# Patient Record
Sex: Female | Born: 1955 | Race: White | Hispanic: No | Marital: Married | Smoking: Never smoker
Health system: Southern US, Community
[De-identification: ages and names within clinical notes are randomized; demographics above are authoritative.]

## PROBLEM LIST (undated history)

## (undated) DIAGNOSIS — I10 Essential (primary) hypertension: Secondary | ICD-10-CM

## (undated) HISTORY — PX: TONSILLECTOMY: SUR1361

## (undated) HISTORY — PX: CARPAL TUNNEL RELEASE: SHX101

## (undated) HISTORY — PX: CHOLECYSTECTOMY: SHX55

## (undated) HISTORY — PX: ABDOMINAL HYSTERECTOMY: SHX81

---

## 2005-02-03 ENCOUNTER — Ambulatory Visit: Payer: Self-pay | Admitting: Internal Medicine

## 2006-02-26 ENCOUNTER — Ambulatory Visit: Payer: Self-pay | Admitting: Internal Medicine

## 2006-06-08 ENCOUNTER — Ambulatory Visit: Payer: Self-pay | Admitting: Gastroenterology

## 2007-05-06 ENCOUNTER — Ambulatory Visit: Payer: Self-pay | Admitting: Internal Medicine

## 2007-05-20 ENCOUNTER — Ambulatory Visit: Payer: Self-pay | Admitting: Gastroenterology

## 2007-06-21 ENCOUNTER — Ambulatory Visit: Payer: Self-pay | Admitting: Unknown Physician Specialty

## 2007-11-15 ENCOUNTER — Ambulatory Visit: Payer: Self-pay | Admitting: Unknown Physician Specialty

## 2008-02-18 ENCOUNTER — Other Ambulatory Visit: Payer: Self-pay

## 2008-02-19 ENCOUNTER — Inpatient Hospital Stay: Payer: Self-pay | Admitting: Internal Medicine

## 2008-06-01 ENCOUNTER — Ambulatory Visit: Payer: Self-pay | Admitting: Internal Medicine

## 2008-06-15 ENCOUNTER — Ambulatory Visit: Payer: Self-pay | Admitting: Specialist

## 2009-06-13 ENCOUNTER — Ambulatory Visit: Payer: Self-pay | Admitting: Internal Medicine

## 2009-06-19 ENCOUNTER — Ambulatory Visit: Payer: Self-pay | Admitting: Internal Medicine

## 2009-10-24 ENCOUNTER — Ambulatory Visit: Payer: Self-pay | Admitting: Internal Medicine

## 2010-06-23 ENCOUNTER — Emergency Department: Payer: Self-pay | Admitting: Emergency Medicine

## 2010-06-26 ENCOUNTER — Ambulatory Visit: Payer: Self-pay | Admitting: Urology

## 2010-09-10 ENCOUNTER — Ambulatory Visit: Payer: Self-pay | Admitting: Internal Medicine

## 2011-09-24 ENCOUNTER — Ambulatory Visit: Payer: Self-pay | Admitting: Internal Medicine

## 2012-10-04 ENCOUNTER — Ambulatory Visit: Payer: Self-pay | Admitting: Internal Medicine

## 2013-11-09 ENCOUNTER — Ambulatory Visit: Payer: Self-pay | Admitting: Family Medicine

## 2014-10-27 ENCOUNTER — Other Ambulatory Visit: Payer: Self-pay | Admitting: Family Medicine

## 2014-10-27 DIAGNOSIS — Z1231 Encounter for screening mammogram for malignant neoplasm of breast: Secondary | ICD-10-CM

## 2014-11-15 ENCOUNTER — Ambulatory Visit
Admission: RE | Admit: 2014-11-15 | Discharge: 2014-11-15 | Disposition: A | Discharge status: Home / Self Care | Payer: No Typology Code available for payment source | Source: Ambulatory Visit | Attending: Family Medicine | Admitting: Family Medicine

## 2014-11-15 DIAGNOSIS — Z1231 Encounter for screening mammogram for malignant neoplasm of breast: Secondary | ICD-10-CM | POA: Insufficient documentation

## 2015-10-30 ENCOUNTER — Other Ambulatory Visit: Payer: Self-pay | Admitting: Family Medicine

## 2015-10-30 DIAGNOSIS — Z1231 Encounter for screening mammogram for malignant neoplasm of breast: Secondary | ICD-10-CM

## 2015-11-19 ENCOUNTER — Ambulatory Visit
Admission: RE | Admit: 2015-11-19 | Discharge: 2015-11-19 | Disposition: A | Discharge status: Home / Self Care | Payer: No Typology Code available for payment source | Source: Ambulatory Visit | Attending: Family Medicine | Admitting: Family Medicine

## 2015-11-19 ENCOUNTER — Other Ambulatory Visit: Payer: Self-pay | Admitting: Family Medicine

## 2015-11-19 DIAGNOSIS — Z1231 Encounter for screening mammogram for malignant neoplasm of breast: Secondary | ICD-10-CM

## 2016-10-08 ENCOUNTER — Other Ambulatory Visit: Payer: Self-pay | Admitting: Family Medicine

## 2016-10-08 DIAGNOSIS — Z1231 Encounter for screening mammogram for malignant neoplasm of breast: Secondary | ICD-10-CM

## 2016-11-19 ENCOUNTER — Ambulatory Visit
Admission: RE | Admit: 2016-11-19 | Discharge: 2016-11-19 | Disposition: A | Discharge status: Home / Self Care | Payer: Managed Care, Other (non HMO) | Source: Ambulatory Visit | Attending: Family Medicine | Admitting: Family Medicine

## 2016-11-19 DIAGNOSIS — Z1231 Encounter for screening mammogram for malignant neoplasm of breast: Secondary | ICD-10-CM | POA: Insufficient documentation

## 2017-03-03 ENCOUNTER — Other Ambulatory Visit: Payer: Self-pay | Admitting: Surgery

## 2017-03-03 DIAGNOSIS — S76011A Strain of muscle, fascia and tendon of right hip, initial encounter: Secondary | ICD-10-CM

## 2017-03-10 ENCOUNTER — Ambulatory Visit
Admission: RE | Admit: 2017-03-10 | Discharge: 2017-03-10 | Disposition: A | Discharge status: Home / Self Care | Payer: 59 | Source: Ambulatory Visit | Attending: Surgery | Admitting: Surgery

## 2017-03-10 DIAGNOSIS — S76011A Strain of muscle, fascia and tendon of right hip, initial encounter: Secondary | ICD-10-CM | POA: Diagnosis present

## 2017-03-10 DIAGNOSIS — M1611 Unilateral primary osteoarthritis, right hip: Secondary | ICD-10-CM | POA: Diagnosis not present

## 2017-03-10 DIAGNOSIS — D168 Benign neoplasm of pelvic bones, sacrum and coccyx: Secondary | ICD-10-CM | POA: Insufficient documentation

## 2017-03-10 DIAGNOSIS — X58XXXA Exposure to other specified factors, initial encounter: Secondary | ICD-10-CM | POA: Diagnosis not present

## 2017-09-07 ENCOUNTER — Other Ambulatory Visit: Payer: Self-pay | Admitting: Surgery

## 2017-09-07 DIAGNOSIS — S76011D Strain of muscle, fascia and tendon of right hip, subsequent encounter: Secondary | ICD-10-CM

## 2017-09-07 DIAGNOSIS — M5431 Sciatica, right side: Secondary | ICD-10-CM

## 2017-09-08 ENCOUNTER — Other Ambulatory Visit: Payer: Self-pay | Admitting: Surgery

## 2017-09-08 DIAGNOSIS — M5431 Sciatica, right side: Secondary | ICD-10-CM

## 2017-09-08 DIAGNOSIS — S76011D Strain of muscle, fascia and tendon of right hip, subsequent encounter: Secondary | ICD-10-CM

## 2017-09-16 ENCOUNTER — Ambulatory Visit
Admission: RE | Admit: 2017-09-16 | Discharge: 2017-09-16 | Disposition: A | Discharge status: Home / Self Care | Payer: 59 | Source: Ambulatory Visit | Attending: Surgery | Admitting: Surgery

## 2017-09-16 DIAGNOSIS — M4316 Spondylolisthesis, lumbar region: Secondary | ICD-10-CM | POA: Diagnosis not present

## 2017-09-16 DIAGNOSIS — M5136 Other intervertebral disc degeneration, lumbar region: Secondary | ICD-10-CM | POA: Diagnosis not present

## 2017-09-16 DIAGNOSIS — S76011D Strain of muscle, fascia and tendon of right hip, subsequent encounter: Secondary | ICD-10-CM | POA: Diagnosis not present

## 2017-09-16 DIAGNOSIS — M5431 Sciatica, right side: Secondary | ICD-10-CM | POA: Diagnosis not present

## 2017-09-16 DIAGNOSIS — M7138 Other bursal cyst, other site: Secondary | ICD-10-CM | POA: Diagnosis not present

## 2017-09-16 DIAGNOSIS — X58XXXD Exposure to other specified factors, subsequent encounter: Secondary | ICD-10-CM | POA: Diagnosis not present

## 2017-09-16 DIAGNOSIS — M48061 Spinal stenosis, lumbar region without neurogenic claudication: Secondary | ICD-10-CM | POA: Diagnosis not present

## 2017-11-05 ENCOUNTER — Other Ambulatory Visit: Payer: Self-pay | Admitting: Family Medicine

## 2017-11-05 DIAGNOSIS — Z1231 Encounter for screening mammogram for malignant neoplasm of breast: Secondary | ICD-10-CM

## 2017-11-24 ENCOUNTER — Ambulatory Visit
Admission: RE | Admit: 2017-11-24 | Discharge: 2017-11-24 | Disposition: A | Discharge status: Home / Self Care | Payer: 59 | Source: Ambulatory Visit | Attending: Family Medicine | Admitting: Family Medicine

## 2017-11-24 DIAGNOSIS — Z1231 Encounter for screening mammogram for malignant neoplasm of breast: Secondary | ICD-10-CM | POA: Diagnosis present

## 2018-08-06 ENCOUNTER — Other Ambulatory Visit: Payer: Self-pay | Admitting: Surgery

## 2018-08-06 DIAGNOSIS — M4316 Spondylolisthesis, lumbar region: Secondary | ICD-10-CM

## 2018-08-06 DIAGNOSIS — M47816 Spondylosis without myelopathy or radiculopathy, lumbar region: Secondary | ICD-10-CM

## 2018-08-16 ENCOUNTER — Ambulatory Visit
Admission: RE | Admit: 2018-08-16 | Discharge: 2018-08-16 | Disposition: A | Discharge status: Home / Self Care | Payer: 59 | Source: Ambulatory Visit | Attending: Surgery | Admitting: Surgery

## 2018-08-16 DIAGNOSIS — M4316 Spondylolisthesis, lumbar region: Secondary | ICD-10-CM | POA: Diagnosis present

## 2018-08-16 DIAGNOSIS — M47816 Spondylosis without myelopathy or radiculopathy, lumbar region: Secondary | ICD-10-CM | POA: Diagnosis present

## 2018-10-12 ENCOUNTER — Other Ambulatory Visit: Payer: Self-pay | Admitting: Family Medicine

## 2018-10-12 DIAGNOSIS — Z1231 Encounter for screening mammogram for malignant neoplasm of breast: Secondary | ICD-10-CM

## 2018-12-08 ENCOUNTER — Ambulatory Visit
Admission: RE | Admit: 2018-12-08 | Discharge: 2018-12-08 | Disposition: A | Discharge status: Home / Self Care | Payer: 59 | Source: Ambulatory Visit | Attending: Family Medicine | Admitting: Family Medicine

## 2018-12-08 ENCOUNTER — Other Ambulatory Visit: Payer: Self-pay

## 2018-12-08 DIAGNOSIS — Z1231 Encounter for screening mammogram for malignant neoplasm of breast: Secondary | ICD-10-CM | POA: Insufficient documentation

## 2019-03-08 IMAGING — MR MR HIP*R* W/O CM
4 of 5 series · 21 of 40 positions shown · non-contrast
Comparison: CT scan 06/23/2010

CLINICAL DATA: Right hip, buttock and upper thigh pain for 18
months.

EXAM:
MR OF THE RIGHT HIP WITHOUT CONTRAST
TECHNIQUE: Multiplanar, multisequence MR imaging was performed. No intravenous
contrast was administered.

[Series 3: T1 · coronal · 4.0mm · 0.78mm/px · 3 of 35 slices shown (1 of 2)]
[im 6/35]
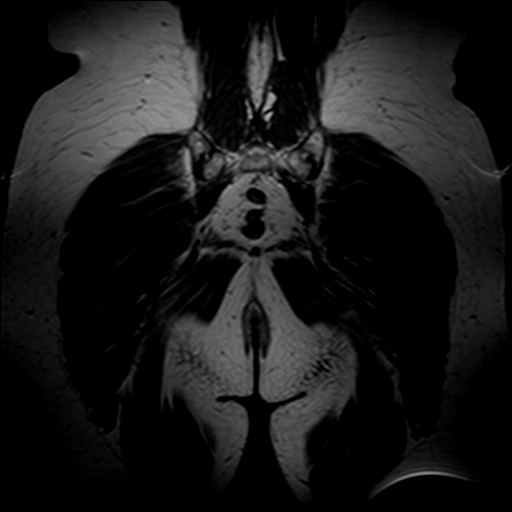
[im 18/35]
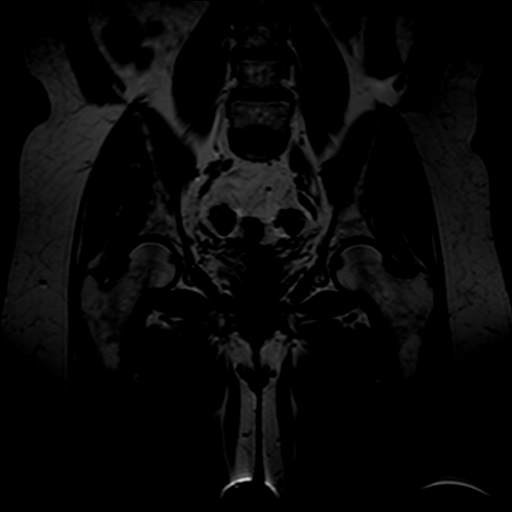
[im 29/35]
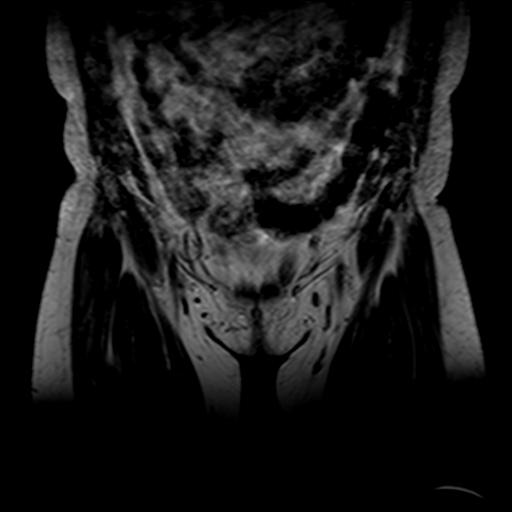

[Series 4: T1 · axial · 4.0mm · 0.78mm/px · z∈[-160,-11]mm · 3 of 40 slices shown (2 of 2)]
[im 5/40]
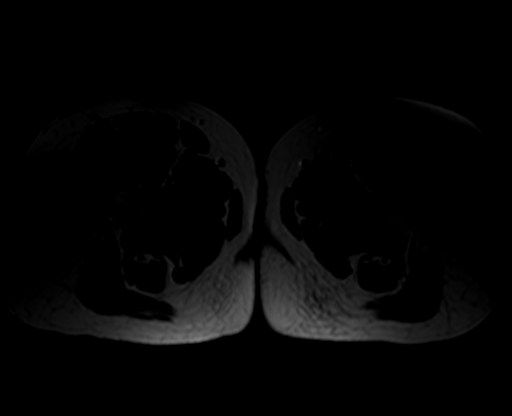
[im 20/40]
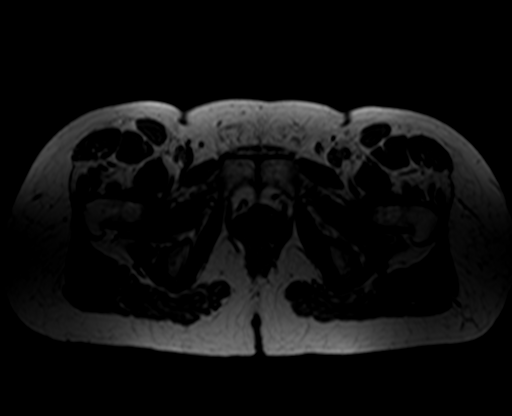
[im 35/40]
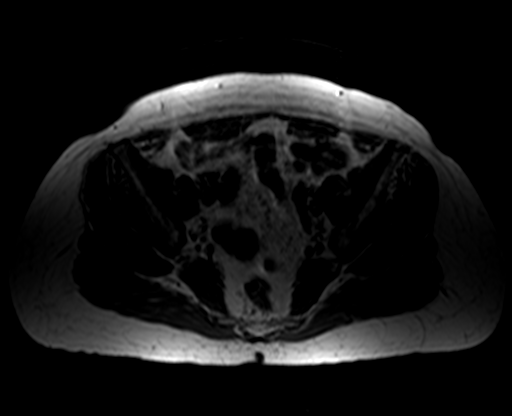

[Series 5: T2 fat-sat · axial · 4.0mm · 0.78mm/px · z∈[-180,-11]mm · 7 of 40 slices shown]
[im 1/40]
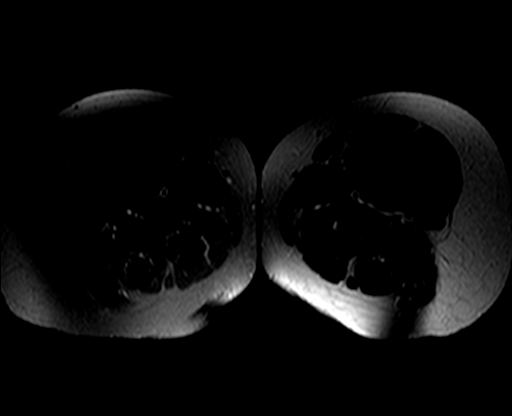
[im 5/40]
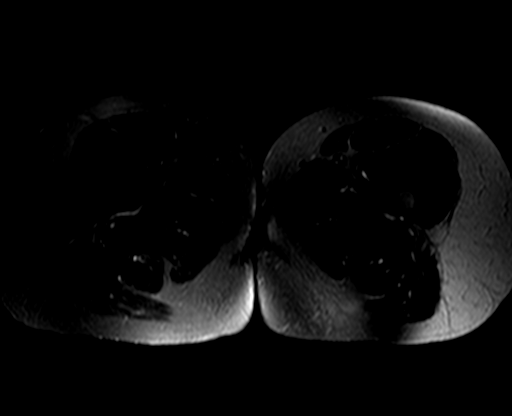
[im 10/40]
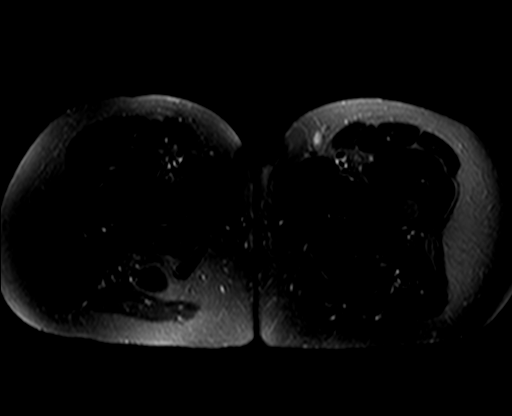
[im 15/40]
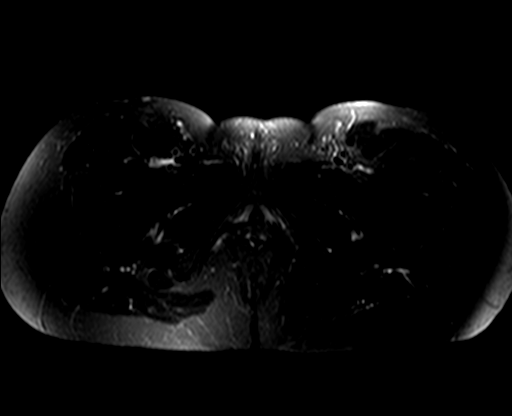
[im 20/40]
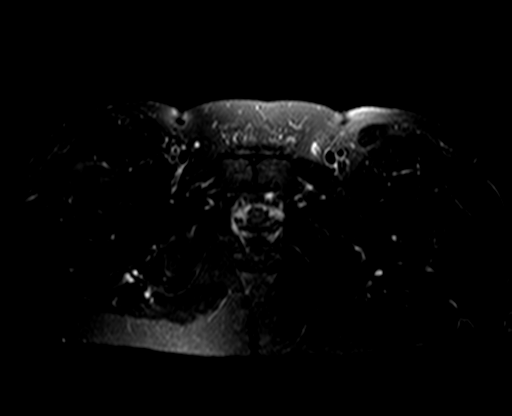
[im 25/40]
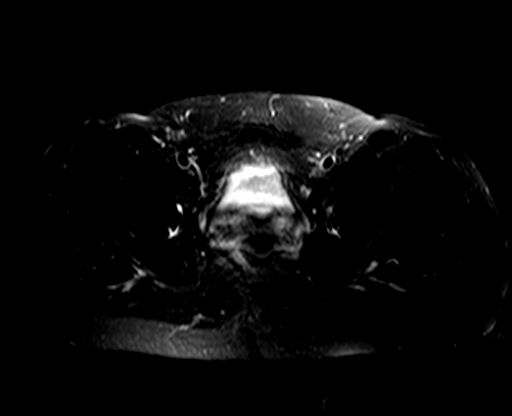
[im 35/40]
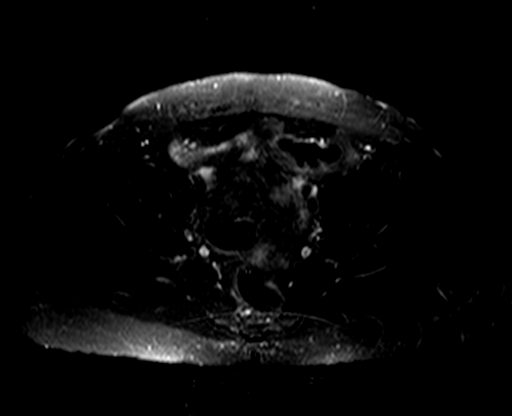

[Series 6: PD fat-sat · sagittal · 4.0mm · 0.35mm/px · 8 of 35 slices shown]
[im 1/35]
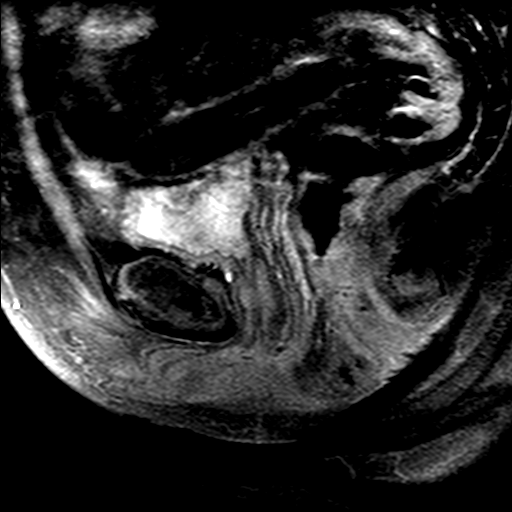
[im 5/35]
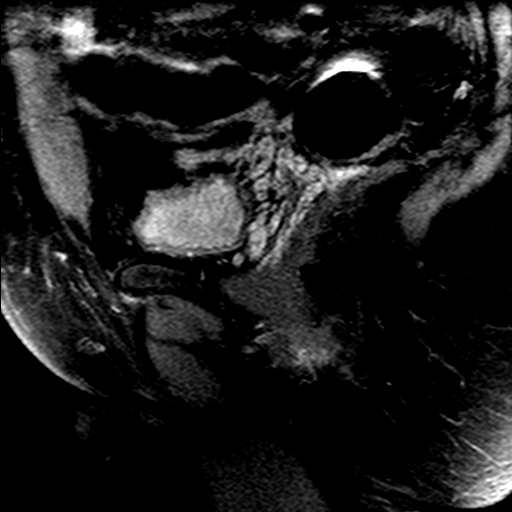
[im 10/35]
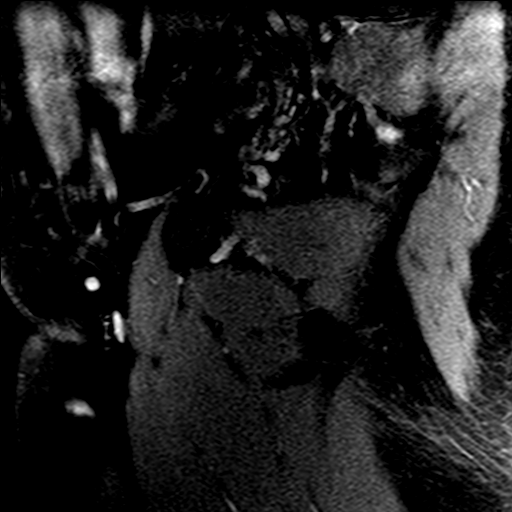
[im 15/35]
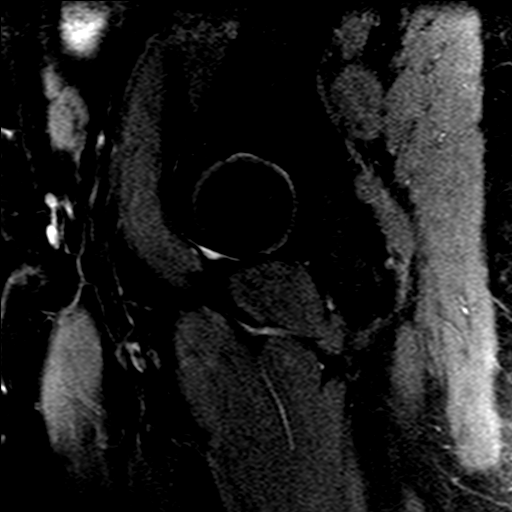
[im 20/35]
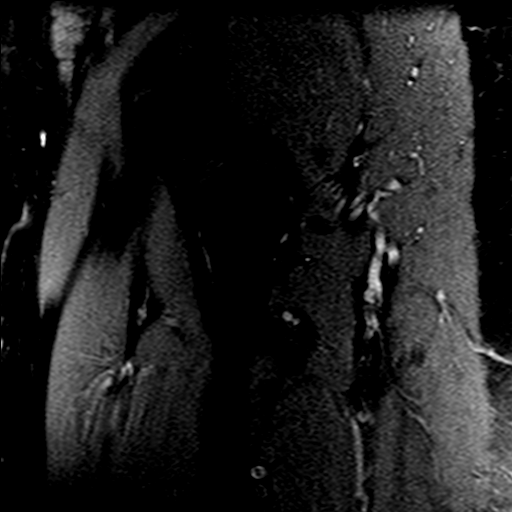
[im 25/35]
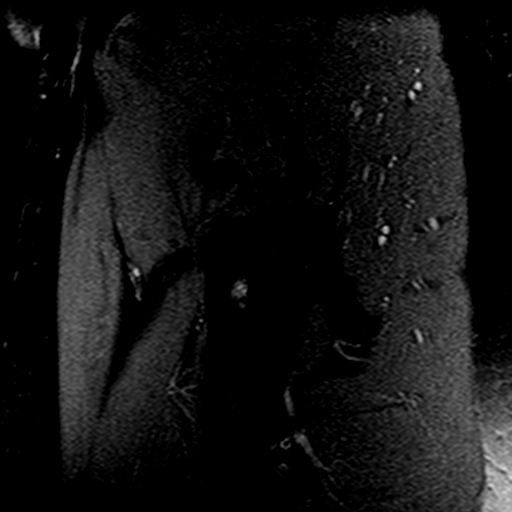
[im 30/35]
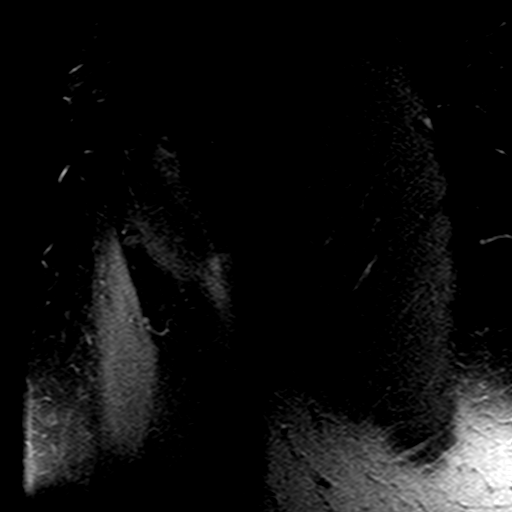
[im 35/35]
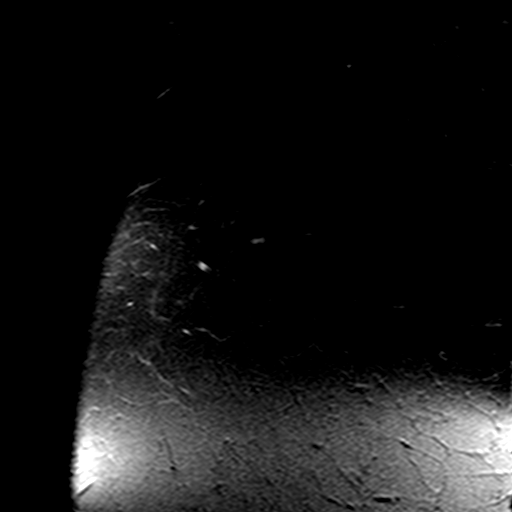

[21 of 40 positions shown; findings below may reference images not displayed]

FINDINGS: Both hips are normally located. Mild to moderate degenerative
changes bilaterally but no stress fracture or AVN. There is a small
lesion involving the greater trochanter of the right hip. Based on
the MRI and prior CT scan this has the appearance of benign
enchondroma. A small cyst/herniation pit is noted in the left
femoral head neck junction region posteriorly and is stable.

The pubic symphysis and SI joints are intact. Mild degenerative
changes. No pelvic fractures or bone lesions.

The surrounding hip and pelvic musculature appear normal. No muscle
tear, myositis, mass or significant fatty atrophy. The major tendons
appear intact. No findings for peritendinosis or trochanteric
bursitis.

No significant intrapelvic abnormalities. No inguinal mass or
hernia.
IMPRESSION: 1. Mild-to-moderate bilateral hip joint degenerative changes but no
stress fracture or AVN.
2. Small stable enchondroma in the greater trochanter of the right
hip.
3. Unremarkable MR appearance of the surrounding hip and pelvic
musculature.

## 2019-07-26 ENCOUNTER — Ambulatory Visit: Payer: 59 | Attending: Internal Medicine

## 2019-07-26 DIAGNOSIS — Z20822 Contact with and (suspected) exposure to covid-19: Secondary | ICD-10-CM

## 2019-07-27 LAB — NOVEL CORONAVIRUS, NAA: SARS-CoV-2, NAA: DETECTED — AB

## 2019-08-01 ENCOUNTER — Other Ambulatory Visit: Payer: Self-pay | Admitting: Nurse Practitioner

## 2019-08-01 DIAGNOSIS — U071 COVID-19: Secondary | ICD-10-CM

## 2019-08-01 DIAGNOSIS — I10 Essential (primary) hypertension: Secondary | ICD-10-CM

## 2019-08-01 NOTE — Progress Notes (Signed)
  I connected by phone with Alyssa White on 08/01/2019 at 7:46 AM to discuss the potential use of an new treatment for mild to moderate COVID-19 viral infection in non-hospitalized patients.  This patient is a 64 y.o. female that meets the FDA criteria for Emergency Use Authorization of bamlanivimab or casirivimab\imdevimab.  Has a (+) direct SARS-CoV-2 viral test result  Has mild or moderate COVID-19   Is ? 62 years of age and weighs ? 40 kg  Is NOT hospitalized due to COVID-19  Is NOT requiring oxygen therapy or requiring an increase in baseline oxygen flow rate due to COVID-19  Is within 10 days of symptom onset  Has at least one of the high risk factor(s) for progression to severe COVID-19 and/or hospitalization as defined in EUA.  Specific high risk criteria : Hypertension   I have spoken and communicated the following to the patient or parent/caregiver:  1. FDA has authorized the emergency use of bamlanivimab and casirivimab\imdevimab for the treatment of mild to moderate COVID-19 in adults and pediatric patients with positive results of direct SARS-CoV-2 viral testing who are 47 years of age and older weighing at least 40 kg, and who are at high risk for progressing to severe COVID-19 and/or hospitalization.  2. The significant known and potential risks and benefits of bamlanivimab and casirivimab\imdevimab, and the extent to which such potential risks and benefits are unknown.  3. Information on available alternative treatments and the risks and benefits of those alternatives, including clinical trials.  4. Patients treated with bamlanivimab and casirivimab\imdevimab should continue to self-isolate and use infection control measures (e.g., wear mask, isolate, social distance, avoid sharing personal items, clean and disinfect "high touch" surfaces, and frequent handwashing) according to CDC guidelines.   5. The patient or parent/caregiver has the option to accept or refuse  bamlanivimab or casirivimab\imdevimab .  After reviewing this information with the patient, The patient agreed to proceed with receiving the bamlanimivab infusion and will be provided a copy of the Fact sheet prior to receiving the infusion.Fenton Foy 08/01/2019 7:46 AM

## 2019-08-03 ENCOUNTER — Encounter (HOSPITAL_COMMUNITY): Payer: Self-pay | Admitting: *Deleted

## 2019-08-03 ENCOUNTER — Ambulatory Visit (HOSPITAL_COMMUNITY)
Admission: RE | Admit: 2019-08-03 | Discharge: 2019-08-03 | Disposition: A | Discharge status: Home / Self Care | Payer: 59 | Source: Ambulatory Visit | Attending: Pulmonary Disease | Admitting: Pulmonary Disease

## 2019-08-03 DIAGNOSIS — I10 Essential (primary) hypertension: Secondary | ICD-10-CM

## 2019-08-03 DIAGNOSIS — Z23 Encounter for immunization: Secondary | ICD-10-CM | POA: Diagnosis not present

## 2019-08-03 DIAGNOSIS — U071 COVID-19: Secondary | ICD-10-CM

## 2019-08-03 MED ORDER — SODIUM CHLORIDE 0.9 % IV SOLN
700.0000 mg | Freq: Once | INTRAVENOUS | Status: AC
  Administered 2019-08-03: 11:00:00 700 mg via INTRAVENOUS
  Filled 2019-08-03: qty 20

## 2019-08-03 MED ORDER — ALBUTEROL SULFATE HFA 108 (90 BASE) MCG/ACT IN AERS: 2.0000 | INHALATION_SPRAY | Freq: Once | RESPIRATORY_TRACT | Status: DC | PRN

## 2019-08-03 MED ORDER — METHYLPREDNISOLONE SODIUM SUCC 125 MG IJ SOLR: 125.0000 mg | Freq: Once | INTRAMUSCULAR | Status: DC | PRN

## 2019-08-03 MED ORDER — FAMOTIDINE IN NACL 20-0.9 MG/50ML-% IV SOLN: 20.0000 mg | Freq: Once | INTRAVENOUS | Status: DC | PRN

## 2019-08-03 MED ORDER — EPINEPHRINE 0.3 MG/0.3ML IJ SOAJ: 0.3000 mg | Freq: Once | INTRAMUSCULAR | Status: DC | PRN

## 2019-08-03 MED ORDER — DIPHENHYDRAMINE HCL 50 MG/ML IJ SOLN: 50.0000 mg | Freq: Once | INTRAMUSCULAR | Status: DC | PRN

## 2019-08-03 MED ORDER — SODIUM CHLORIDE 0.9 % IV SOLN
INTRAVENOUS | Status: DC | PRN
  Administered 2019-08-03: 10:00:00 250 mL via INTRAVENOUS

## 2019-08-03 NOTE — Discharge Instructions (Signed)

## 2019-08-03 NOTE — Progress Notes (Signed)
  Diagnosis: COVID-19  Physician: Dr. Derinda Late  Procedure: [facility] Med: bamlanivimab infusion - Provided patient with bamlanimivab fact sheet for patients, parents and caregivers prior to infusion.  Complications: No immediate complications noted.  Discharge: Discharged home   Thais Silberstein L 08/03/2019

## 2019-09-26 ENCOUNTER — Ambulatory Visit: Payer: Self-pay | Admitting: *Deleted

## 2019-09-26 NOTE — Telephone Encounter (Signed)
Summary: 2nd dose   Patient has questions regarding when she got the infusion and when she can get 2nd dose of vaccine  [id number]  Press 0 ask for [facility]

## 2019-09-26 NOTE — Telephone Encounter (Signed)
  Reason for Disposition . General information question, no triage required and triager able to answer question  Answer Assessment - Initial Assessment Questions 1. REASON FOR CALL or QUESTION: "What is your reason for calling today?" or "How can I best help you?" or "What question do you have that I can help answer?"     How long after infusion do you have to wait before getting vaccine for COVID? Advised patient 90 days and if she is over the 6 week time period- she may have to restart vaccine. Patient states she had been told recommended time had changes to 45 days- advised patient she may call infusion clinic for verification- number given.  Protocols used: INFORMATION ONLY CALL - NO TRIAGE-A-AH

## 2019-11-17 ENCOUNTER — Other Ambulatory Visit: Payer: Self-pay | Admitting: Family Medicine

## 2019-11-17 DIAGNOSIS — Z1231 Encounter for screening mammogram for malignant neoplasm of breast: Secondary | ICD-10-CM

## 2019-12-09 ENCOUNTER — Ambulatory Visit
Admission: RE | Admit: 2019-12-09 | Discharge: 2019-12-09 | Disposition: A | Discharge status: Home / Self Care | Payer: No Typology Code available for payment source | Source: Ambulatory Visit | Attending: Family Medicine | Admitting: Family Medicine

## 2019-12-09 DIAGNOSIS — Z1231 Encounter for screening mammogram for malignant neoplasm of breast: Secondary | ICD-10-CM | POA: Diagnosis present

## 2019-12-15 ENCOUNTER — Other Ambulatory Visit: Payer: Self-pay | Admitting: Family Medicine

## 2019-12-15 DIAGNOSIS — R928 Other abnormal and inconclusive findings on diagnostic imaging of breast: Secondary | ICD-10-CM

## 2019-12-19 ENCOUNTER — Ambulatory Visit
Admission: RE | Admit: 2019-12-19 | Discharge: 2019-12-19 | Disposition: A | Discharge status: Home / Self Care | Payer: 59 | Source: Ambulatory Visit | Attending: Family Medicine | Admitting: Family Medicine

## 2019-12-19 DIAGNOSIS — R928 Other abnormal and inconclusive findings on diagnostic imaging of breast: Secondary | ICD-10-CM | POA: Insufficient documentation

## 2020-11-20 ENCOUNTER — Other Ambulatory Visit: Payer: Self-pay | Admitting: Family Medicine

## 2020-11-20 DIAGNOSIS — Z1231 Encounter for screening mammogram for malignant neoplasm of breast: Secondary | ICD-10-CM

## 2020-12-11 ENCOUNTER — Other Ambulatory Visit: Payer: Self-pay

## 2020-12-11 ENCOUNTER — Ambulatory Visit
Admission: RE | Admit: 2020-12-11 | Discharge: 2020-12-11 | Disposition: A | Discharge status: Home / Self Care | Payer: 59 | Source: Ambulatory Visit | Attending: Family Medicine | Admitting: Family Medicine

## 2020-12-11 DIAGNOSIS — Z1231 Encounter for screening mammogram for malignant neoplasm of breast: Secondary | ICD-10-CM | POA: Diagnosis not present

## 2021-01-10 ENCOUNTER — Ambulatory Visit
Admission: EM | Admit: 2021-01-10 | Discharge: 2021-01-10 | Disposition: A | Discharge status: Home / Self Care | Payer: 59 | Attending: Physician Assistant | Admitting: Physician Assistant

## 2021-01-10 ENCOUNTER — Other Ambulatory Visit: Payer: Self-pay

## 2021-01-10 DIAGNOSIS — R319 Hematuria, unspecified: Secondary | ICD-10-CM | POA: Diagnosis present

## 2021-01-10 DIAGNOSIS — R300 Dysuria: Secondary | ICD-10-CM | POA: Insufficient documentation

## 2021-01-10 DIAGNOSIS — I10 Essential (primary) hypertension: Secondary | ICD-10-CM | POA: Insufficient documentation

## 2021-01-10 DIAGNOSIS — R109 Unspecified abdominal pain: Secondary | ICD-10-CM | POA: Insufficient documentation

## 2021-01-10 DIAGNOSIS — N390 Urinary tract infection, site not specified: Secondary | ICD-10-CM | POA: Diagnosis present

## 2021-01-10 HISTORY — DX: Essential (primary) hypertension: I10

## 2021-01-10 LAB — URINALYSIS, COMPLETE (UACMP) WITH MICROSCOPIC
Bilirubin Urine: NEGATIVE
Glucose, UA: NEGATIVE mg/dL
Ketones, ur: NEGATIVE mg/dL
Nitrite: NEGATIVE
Protein, ur: 30 mg/dL — AB
Specific Gravity, Urine: 1.01 (ref 1.005–1.030)
WBC, UA: >50 WBC/hpf (ref 0–5)
pH: 6 (ref 5.0–8.0)

## 2021-01-10 MED ORDER — SULFAMETHOXAZOLE-TRIMETHOPRIM 800-160 MG PO TABS: 1.0000 | ORAL_TABLET | Freq: Two times a day (BID) | ORAL | 0 refills | Status: AC

## 2021-01-10 MED ORDER — PHENAZOPYRIDINE HCL 200 MG PO TABS: 200.0000 mg | ORAL_TABLET | Freq: Three times a day (TID) | ORAL | 0 refills | Status: DC

## 2021-01-10 NOTE — ED Provider Notes (Signed)
[facility]    CSN: [id number] Arrival date & time: 01/10/21  1121      History   Chief Complaint Chief Complaint  Patient presents with   Urinary Frequency    HPI Alyssa White is a 65 y.o. female presenting for approximately 1 week history of dysuria and suprapubic pressure as well as increased pain at the end of urination, urinary frequency and urgency.  Patient says that today she started develop some aching in her right flank area.  She says it is not severe.  She denies any associated fever, fatigue, chills, sweats, hematuria or abnormal vaginal discharge.  Patient has not taken any over-the-counter medication for symptoms.  Patient says that she has a history of having a really bad UTI that was "almost sepsis."  Patient has no other concerns today.  HPI  Past Medical History:  Diagnosis Date   Hypertension     There are no problems to display for this patient.   Past Surgical History:  Procedure Laterality Date   ABDOMINAL HYSTERECTOMY     CARPAL TUNNEL RELEASE Bilateral    CHOLECYSTECTOMY     TONSILLECTOMY      OB History   No obstetric history on file.      Home Medications    Prior to Admission medications   Medication Sig Start Date End Date Taking? Authorizing Provider  atorvastatin (LIPITOR) 10 MG tablet Take 10 mg by mouth daily. 11/29/20  Yes [provider]  escitalopram (LEXAPRO) 10 MG tablet Take 10 mg by mouth daily. 11/29/20  Yes [provider]  estradiol (VIVELLE-DOT) 0.05 MG/24HR patch 1 patch 2 (two) times a week. 11/05/20  Yes [provider]  hydrochlorothiazide (HYDRODIURIL) 12.5 MG tablet Take 12.5 mg by mouth daily. 11/29/20  Yes [provider]  phenazopyridine (PYRIDIUM) 200 MG tablet Take 1 tablet (200 mg total) by mouth 3 (three) times daily. 01/10/21  Yes Laurene Footman B, PA-C  sulfamethoxazole-trimethoprim (BACTRIM DS) 800-160 MG tablet Take 1 tablet by mouth 2 (two) times daily for 7  days. 01/10/21 01/17/21 Yes Laurene Footman B, PA-C  valsartan (DIOVAN) 40 MG tablet Take 40 mg by mouth daily. 11/29/20  Yes [provider]    Family History Family History  Problem Relation Age of Onset   Parkinsonism Mother    Dementia Mother    Heart attack Father    Breast cancer Paternal Aunt 75    Social History Social History   Tobacco Use   Smoking status: Never   Smokeless tobacco: Never  Vaping Use   Vaping Use: Never used  Substance Use Topics   Alcohol use: Never   Drug use: Never     Allergies   Ampicillin and Azithromycin   Review of Systems Review of Systems  Constitutional:  Negative for chills, fatigue and fever.  Gastrointestinal:  Negative for abdominal pain, diarrhea, nausea and vomiting.  Genitourinary:  Positive for dysuria, flank pain, frequency and urgency. Negative for decreased urine volume, hematuria, pelvic pain, vaginal bleeding, vaginal discharge and vaginal pain.  Skin:  Negative for rash.    Physical Exam Triage Vital Signs ED Triage Vitals  Enc Vitals Group     BP 01/10/21 1140 (!) 174/84     Pulse Rate 01/10/21 1140 61     Resp 01/10/21 1140 18     Temp 01/10/21 1140 98.1 F (36.7 C)     Temp Source 01/10/21 1140 Oral     SpO2 01/10/21 1140 100 %  Weight 01/10/21 1136 157 lb (71.2 kg)     Height 01/10/21 1136 5' (1.524 m)     Head Circumference --      Peak Flow --      Pain Score 01/10/21 1136 7     Pain Loc --      Pain Edu? --      Excl. in [place]? --    No data found.  Updated Vital Signs BP (!) 164/94 (BP Location: Left Arm)   Pulse 61   Temp 98.1 F (36.7 C) (Oral)   Resp 18   Ht 5' (1.524 m)   Wt 157 lb (71.2 kg)   SpO2 100%   BMI 30.66 kg/m      Physical Exam Vitals and nursing note reviewed.  Constitutional:      General: She is not in acute distress.    Appearance: Normal appearance. She is not ill-appearing or toxic-appearing.  HENT:     Head: Normocephalic and atraumatic.  Eyes:      General: No scleral icterus.       Right eye: No discharge.        Left eye: No discharge.     Conjunctiva/sclera: Conjunctivae normal.  Cardiovascular:     Rate and Rhythm: Normal rate and regular rhythm.     Heart sounds: Normal heart sounds.  Pulmonary:     Effort: Pulmonary effort is normal. No respiratory distress.     Breath sounds: Normal breath sounds.  Abdominal:     Palpations: Abdomen is soft.     Tenderness: There is no abdominal tenderness. There is no right CVA tenderness or left CVA tenderness.  Musculoskeletal:     Cervical back: Neck supple.  Skin:    General: Skin is dry.  Neurological:     General: No focal deficit present.     Mental Status: She is alert. Mental status is at baseline.     Motor: No weakness.     Gait: Gait normal.  Psychiatric:        Mood and Affect: Mood normal.        Behavior: Behavior normal.        Thought Content: Thought content normal.     UC Treatments / Results  Labs (all labs ordered are listed, but only abnormal results are displayed) Labs Reviewed  URINALYSIS, COMPLETE (UACMP) WITH MICROSCOPIC - Abnormal; Notable for the following components:      Result Value   APPearance HAZY (*)    Hgb urine dipstick LARGE (*)    Protein, ur 30 (*)    Leukocytes,Ua MODERATE (*)    Bacteria, UA FEW (*)    All other components within normal limits  URINE CULTURE    EKG   Radiology No results found.  Procedures Procedures (including critical care time)  Medications Ordered in UC Medications - No data to display  Initial Impression / Assessment and Plan / UC Course  I have reviewed the triage vital signs and the nursing notes.  Pertinent labs & imaging results that were available during my care of the patient were reviewed by me and considered in my medical decision making (see chart for details).  65 year old female presenting for dysuria, urinary frequency and urgency for a week and right sided flank pain starting today.   Exam is within normal limits.  She has no abdominal tenderness or CVA tenderness on exam.  Urinalysis shows hazy appearance to urine, large blood, protein, moderate leukocytes and few bacteria.  We will send urine for culture and treat her with Bactrim DS at this time in case there is any a sending infection.  Patient reports unclear reaction to ampicillin in the past.  Advised her to increase rest and fluids.  Advised she can take Pyridium as needed for the discomfort.  I reviewed the ED precautions with patient and advised her to go to ED for any fever or severe acute worsening of her flank pain.  Patient agrees.   Final Clinical Impressions(s) / UC Diagnoses   Final diagnoses:  Urinary tract infection with hematuria, site unspecified  Dysuria  Right flank pain  Essential hypertension     Discharge Instructions      UTI: Based on either symptoms or urinalysis, you may have a urinary tract infection. We will send the urine for culture and call with results in a few days. Begin antibiotics at this time. Your symptoms should be much improved over the next 2-3 days. Increase rest and fluid intake. If for some reason symptoms are worsening or not improving after a couple of days or the urine culture determines the antibiotics you are taking will not treat the infection, the antibiotics may be changed. Return or go to ER for fever, back pain, worsening urinary pain, discharge, increased blood in urine. May take Tylenol or Motrin OTC for pain relief or consider AZO if no contraindications   ELEVATED BP: Your blood pressure is high.  Keep a log of your blood pressure and follow-up with your primary care provider if it continues to be elevated over 140/90.  Make sure you are taking her medication correctly.     ED Prescriptions     Medication Sig Dispense Auth. Provider   sulfamethoxazole-trimethoprim (BACTRIM DS) 800-160 MG tablet Take 1 tablet by mouth 2 (two) times daily for 7 days. 14 tablet  Laurene Footman B, PA-C   phenazopyridine (PYRIDIUM) 200 MG tablet Take 1 tablet (200 mg total) by mouth 3 (three) times daily. 6 tablet Gretta Cool      PDMP not reviewed this encounter.   Danton Clap, PA-C 01/10/21 1233

## 2021-01-10 NOTE — ED Triage Notes (Signed)
Patient states that she has been having urinary urgency and frequency, pain at the end of urinary stream, right flank pain x 1 week. States that symptoms have been worsening since yesterday.

## 2021-01-10 NOTE — Discharge Instructions (Addendum)
UTI: Based on either symptoms or urinalysis, you may have a urinary tract infection. We will send the urine for culture and call with results in a few days. Begin antibiotics at this time. Your symptoms should be much improved over the next 2-3 days. Increase rest and fluid intake. If for some reason symptoms are worsening or not improving after a couple of days or the urine culture determines the antibiotics you are taking will not treat the infection, the antibiotics may be changed. Return or go to ER for fever, back pain, worsening urinary pain, discharge, increased blood in urine. May take Tylenol or Motrin OTC for pain relief or consider AZO if no contraindications   ELEVATED BP: Your blood pressure is high.  Keep a log of your blood pressure and follow-up with your primary care provider if it continues to be elevated over 140/90.  Make sure you are taking her medication correctly.

## 2021-01-13 LAB — URINE CULTURE: Culture: 90000 — AB

## 2021-03-04 ENCOUNTER — Encounter: Payer: Self-pay | Admitting: Urology

## 2021-03-04 ENCOUNTER — Ambulatory Visit (INDEPENDENT_AMBULATORY_CARE_PROVIDER_SITE_OTHER): Payer: 59 | Admitting: Urology

## 2021-03-04 ENCOUNTER — Other Ambulatory Visit: Payer: Self-pay

## 2021-03-04 VITALS — BP 140/86 | HR 71 | Ht 60.0 in | Wt 152.0 lb

## 2021-03-04 DIAGNOSIS — N390 Urinary tract infection, site not specified: Secondary | ICD-10-CM | POA: Diagnosis not present

## 2021-03-04 LAB — MICROSCOPIC EXAMINATION: Bacteria, UA: NONE SEEN

## 2021-03-04 LAB — URINALYSIS, COMPLETE
Bilirubin, UA: NEGATIVE
Glucose, UA: NEGATIVE
Ketones, UA: NEGATIVE
Leukocytes,UA: NEGATIVE
Nitrite, UA: NEGATIVE
Protein,UA: NEGATIVE
RBC, UA: NEGATIVE
Specific Gravity, UA: >1.03 — ABNORMAL HIGH (ref 1.005–1.030)
Urobilinogen, Ur: 0.2 mg/dL (ref 0.2–1.0)
pH, UA: 6 (ref 5.0–7.5)

## 2021-03-04 NOTE — Progress Notes (Signed)
03/04/2021 3:40 PM   Alyssa White 01-Jan-1956 [id number]  Referring provider: Derinda Late, MD [phone] [facility] [place],  [place] [address]  Chief Complaint  Patient presents with   Urinary Tract Infection    HPI: Patient describes her recent bladder infection.  She was having pressure and frequency and given Pyridium and sulfa.  She still was having pressure after the antibiotic but symptoms are now normalized.  She is back to baseline  She voids every 2 hours and sometimes gets up once a night.  She wears a pad for leaking with coughing sneezing but is minimal.  She did have a positive urine culture  No history of urinary tract infections kidney stones or bladder surgery.   PMH: Past Medical History:  Diagnosis Date   Hypertension     Surgical History: Past Surgical History:  Procedure Laterality Date   ABDOMINAL HYSTERECTOMY     CARPAL TUNNEL RELEASE Bilateral    CHOLECYSTECTOMY     TONSILLECTOMY      Home Medications:  Allergies as of 03/04/2021       Reactions   Ampicillin    Azithromycin    Brompheniramine-pseudoeph Other (See Comments)   Other reaction(s): Other (See Comments)        Medication List        Accurate as of March 04, 2021  3:40 PM. If you have any questions, ask your nurse or doctor.          STOP taking these medications    phenazopyridine 200 MG tablet Commonly known as: PYRIDIUM Stopped by: Reece Packer, MD       TAKE these medications    atorvastatin 10 MG tablet Commonly known as: LIPITOR Take 10 mg by mouth daily.   escitalopram 10 MG tablet Commonly known as: LEXAPRO Take 10 mg by mouth daily.   estradiol 0.05 MG/24HR patch Commonly known as: VIVELLE-DOT 1 patch 2 (two) times a week.   hydrochlorothiazide 12.5 MG tablet Commonly known as: HYDRODIURIL Take 12.5 mg by mouth daily.   valsartan 40 MG tablet Commonly known as: DIOVAN Take 40 mg  by mouth daily.        Allergies:  Allergies  Allergen Reactions   Ampicillin    Azithromycin    Brompheniramine-Pseudoeph Other (See Comments)    Other reaction(s): Other (See Comments)    Family History: Family History  Problem Relation Age of Onset   Parkinsonism Mother    Dementia Mother    Heart attack Father    Breast cancer Paternal Aunt 54    Social History:  reports that she has never smoked. She has never used smokeless tobacco. She reports that she does not drink alcohol and does not use drugs.  ROS:                                        Physical Exam: BP 140/86   Pulse 71   Ht 5' (1.524 m)   Wt 68.9 kg   BMI 29.69 kg/m   Constitutional:  Alert and oriented, No acute distress. HEENT: [place] AT, moist mucus membranes.  Trachea midline, no masses. Cardiovascular: No clubbing, cyanosis, or edema. Respiratory: Normal respiratory effort, no increased work of breathing. GI: Abdomen is soft, nontender, nondistended, no abdominal masses GU: No CVA tenderness.  Mild grade 2 hypermobility bladder neck and  a mild pos cough test Skin: No rashes, bruises or suspicious lesions. Lymph: No cervical or inguinal adenopathy. Neurologic: Grossly intact, no focal deficits, moving all 4 extremities. Psychiatric: Normal mood and affect.  Laboratory Data: No results found for: WBC, HGB, HCT, MCV, PLT  No results found for: CREATININE  No results found for: PSA  No results found for: TESTOSTERONE  No results found for: HGBA1C  Urinalysis    Component Value Date/Time   COLORURINE YELLOW 01/10/2021 1132   APPEARANCEUR HAZY (A) 01/10/2021 1132   LABSPEC 1.010 01/10/2021 1132   PHURINE 6.0 01/10/2021 1132   GLUCOSEU NEGATIVE 01/10/2021 1132   HGBUR LARGE (A) 01/10/2021 1132   BILIRUBINUR NEGATIVE 01/10/2021 1132   KETONESUR NEGATIVE 01/10/2021 1132   PROTEINUR 30 (A) 01/10/2021 1132   NITRITE NEGATIVE 01/10/2021 1132   LEUKOCYTESUR MODERATE  (A) 01/10/2021 1132    Pertinent Imaging: Urine normal.  Chart reviewed.  Urine sent for culture.  Assessment & Plan: Reassurance given.  Mild frequency.  1 bladder infection.  Mild stress incontinence.  CPR  1. Recurrent UTI  - Urinalysis, Complete - CULTURE, URINE COMPREHENSIVE   No follow-ups on file.  Reece Packer, MD  [facility] [address], [place], [place] [address] [phone]

## 2021-03-06 LAB — CULTURE, URINE COMPREHENSIVE

## 2021-10-28 ENCOUNTER — Other Ambulatory Visit: Payer: Self-pay | Admitting: Family Medicine

## 2021-10-28 DIAGNOSIS — Z1231 Encounter for screening mammogram for malignant neoplasm of breast: Secondary | ICD-10-CM

## 2021-12-25 ENCOUNTER — Ambulatory Visit
Admission: RE | Admit: 2021-12-25 | Discharge: 2021-12-25 | Disposition: A | Discharge status: Home / Self Care | Payer: 59 | Source: Ambulatory Visit | Attending: Family Medicine | Admitting: Family Medicine

## 2021-12-25 DIAGNOSIS — Z1231 Encounter for screening mammogram for malignant neoplasm of breast: Secondary | ICD-10-CM | POA: Insufficient documentation

## 2022-11-12 ENCOUNTER — Other Ambulatory Visit: Payer: Self-pay | Admitting: Family Medicine

## 2022-11-12 DIAGNOSIS — Z1231 Encounter for screening mammogram for malignant neoplasm of breast: Secondary | ICD-10-CM

## 2023-01-01 ENCOUNTER — Ambulatory Visit
Admission: RE | Admit: 2023-01-01 | Discharge: 2023-01-01 | Disposition: A | Discharge status: Home / Self Care | Payer: BC Managed Care – PPO | Source: Ambulatory Visit | Attending: Family Medicine | Admitting: Family Medicine

## 2023-01-01 DIAGNOSIS — Z1231 Encounter for screening mammogram for malignant neoplasm of breast: Secondary | ICD-10-CM | POA: Diagnosis present

## 2023-01-16 ENCOUNTER — Ambulatory Visit: Payer: 59 | Admitting: Urology

## 2023-01-30 ENCOUNTER — Encounter: Payer: Self-pay | Admitting: Urology

## 2023-01-30 ENCOUNTER — Other Ambulatory Visit: Payer: Self-pay | Admitting: Urology

## 2023-01-30 ENCOUNTER — Ambulatory Visit: Payer: BC Managed Care – PPO | Admitting: Urology

## 2023-01-30 ENCOUNTER — Other Ambulatory Visit
Admission: RE | Admit: 2023-01-30 | Discharge: 2023-01-30 | Disposition: A | Discharge status: Home / Self Care | Payer: BC Managed Care – PPO | Attending: Urology | Admitting: Urology

## 2023-01-30 VITALS — BP 144/88 | HR 77 | Ht 60.0 in | Wt 122.0 lb

## 2023-01-30 DIAGNOSIS — Z87440 Personal history of urinary (tract) infections: Secondary | ICD-10-CM | POA: Diagnosis not present

## 2023-01-30 DIAGNOSIS — N390 Urinary tract infection, site not specified: Secondary | ICD-10-CM | POA: Diagnosis not present

## 2023-01-30 DIAGNOSIS — R3129 Other microscopic hematuria: Secondary | ICD-10-CM

## 2023-01-30 LAB — URINALYSIS, COMPLETE (UACMP) WITH MICROSCOPIC
Bilirubin Urine: NEGATIVE
Glucose, UA: NEGATIVE mg/dL
Hgb urine dipstick: NEGATIVE
Leukocytes,Ua: NEGATIVE
Nitrite: NEGATIVE
Protein, ur: NEGATIVE mg/dL
Specific Gravity, Urine: 1.025 (ref 1.005–1.030)
pH: 5.5 (ref 5.0–8.0)

## 2023-01-30 LAB — BLADDER SCAN AMB NON-IMAGING

## 2023-01-30 NOTE — Patient Instructions (Addendum)
Take daily probiotic, cranberry tablets, and D-mannose for UTI prevention

## 2023-02-02 NOTE — Progress Notes (Incomplete)
I, Alyssa White,acting as a scribe for Alyssa Scotland, MD.,have documented all relevant documentation on the behalf of Alyssa Scotland, MD,as directed by  Alyssa Scotland, MD while in the presence of Alyssa Scotland, MD.   01/30/23 9:32 PM   Alyssa White [birth year] [id number]  Referring provider: Bobby Rumpf, MD [address] [place],  [place] [address]  Chief Complaint  Patient presents with   Urinary Tract Infection    HPI: 67 year-old female who returns today to establish care with me.   She previously saw Dr. Sherron Monday, refused to go back and see him. She presents with the same issue which is recurrent UTIs. He did prescribe a topical estrogen cream, which she is using still every other day. She was having some frequency and leakage with laughing and sneezing, but this was minimal. Review of her chart shows no documented infections since that time, she did have a CT scan from back in 2011 that's unremarkable. She was seen three times in the past calendar year at [facility] urgent care. She does have two documented bacterial infections, one in January and May, that both grew E. coli, which was resistant only to ampicillin, otherwise pan-sensitive.  She reports that she had no UTIs for two years until November, after stopping her hormone patch in October on her primary care doctor's advice. Since then, she has had four UTIs, three of which grew E. coli. She was treated twice with Omnicef and twice with Cipro. She notes that UTIs seem to occur more frequently after intercourse. She was prescribed Estradiol Cream by Dr. Larwance White, which she uses twice a week. Drinks one cup of caffeinated coffee in the morning, otherwise drinks water. She denies painful intercourse.    Results for orders placed or performed during the hospital encounter of 01/30/23  Urinalysis, Complete w Microscopic -  Result Value Ref Range   Color, Urine YELLOW YELLOW   APPearance CLEAR CLEAR   Specific Gravity,  Urine 1.025 1.005 - 1.030   pH 5.5 5.0 - 8.0   Glucose, UA NEGATIVE NEGATIVE mg/dL   Hgb urine dipstick NEGATIVE NEGATIVE   Bilirubin Urine NEGATIVE NEGATIVE   Ketones, ur TRACE (A) NEGATIVE mg/dL   Protein, ur NEGATIVE NEGATIVE mg/dL   Nitrite NEGATIVE NEGATIVE   Leukocytes,Ua NEGATIVE NEGATIVE   Squamous Epithelial / HPF 0-5 0 - 5 /HPF   WBC, UA 0-5 0 - 5 WBC/hpf   RBC / HPF 11-20 0 - 5 RBC/hpf   Bacteria, UA FEW (A) NONE SEEN   Mucus PRESENT   Results for orders placed or performed in visit on 01/30/23  Bladder Scan (Post Void Residual) in office  Result Value Ref Range   Scan Result 1ml      PMH: Past Medical History:  Diagnosis Date   Hypertension     Surgical History: Past Surgical History:  Procedure Laterality Date   ABDOMINAL HYSTERECTOMY     CARPAL TUNNEL RELEASE Bilateral    CHOLECYSTECTOMY     TONSILLECTOMY      Home Medications:  Allergies as of 01/30/2023       Reactions   Ampicillin    Azithromycin    Brompheniramine-pseudoeph Other (See Comments)   Other reaction(s): Other (See Comments)        Medication List        Accurate as of January 30, 2023 11:59 PM. If you have any questions, ask your nurse or doctor.          STOP  taking these medications    estradiol 0.05 MG/24HR patch Commonly known as: VIVELLE-DOT Stopped by: Alyssa White   hydrochlorothiazide 12.5 MG tablet Commonly known as: HYDRODIURIL Stopped by: Alyssa White       TAKE these medications    atorvastatin 10 MG tablet Commonly known as: LIPITOR Take 10 mg by mouth daily.   conjugated estrogens vaginal cream Commonly known as: PREMARIN Place vaginally 2 (two) times a week.   escitalopram 10 MG tablet Commonly known as: LEXAPRO Take 10 mg by mouth daily.   valsartan 40 MG tablet Commonly known as: DIOVAN Take 40 mg by mouth daily.        Allergies:  Allergies  Allergen Reactions   Ampicillin    Azithromycin    Brompheniramine-Pseudoeph Other  (See Comments)    Other reaction(s): Other (See Comments)    Family History: Family History  Problem Relation Age of Onset   Parkinsonism Mother    Dementia Mother    Heart attack Father    Breast cancer Paternal Aunt 56    Social History:  reports that she has never smoked. She has never used smokeless tobacco. She reports that she does not drink alcohol and does not use drugs.   Physical Exam: BP (!) 144/88 (BP Location: Left Arm, Patient Position: Sitting, Cuff Size: Normal)   Pulse 77   Ht 5' (1.524 m)   Wt 122 lb (55.3 kg)   BMI 23.83 kg/m   Constitutional:  Alert and oriented, No acute distress. HEENT: [place] AT, moist mucus membranes.  Trachea midline, no masses. Neurologic: Grossly intact, no focal deficits, moving all 4 extremities. Psychiatric: Normal mood and affect.  Urinalysis    Component Value Date/Time   COLORURINE YELLOW 01/30/2023 1502   APPEARANCEUR CLEAR 01/30/2023 1502   APPEARANCEUR Clear 03/04/2021 1512   LABSPEC 1.025 01/30/2023 1502   PHURINE 5.5 01/30/2023 1502   GLUCOSEU NEGATIVE 01/30/2023 1502   HGBUR NEGATIVE 01/30/2023 1502   BILIRUBINUR NEGATIVE 01/30/2023 1502   BILIRUBINUR Negative 03/04/2021 1512   KETONESUR TRACE (A) 01/30/2023 1502   PROTEINUR NEGATIVE 01/30/2023 1502   NITRITE NEGATIVE 01/30/2023 1502   LEUKOCYTESUR NEGATIVE 01/30/2023 1502    Lab Results  Component Value Date   LABMICR See below: 03/04/2021   WBCUA 0-5 03/04/2021   LABEPIT 0-10 03/04/2021   BACTERIA FEW (A) 01/30/2023    Assessment & Plan:    Recurrent UTIs -Increase Estradiol Cream to three times a week, using a pea-sized amount applied directly to the urethra. -Recommend cranberry tablets twice a day, a probiotic, and D-Mannose supplements. -Monitor for any new UTIs and contact the office if symptoms arise.  Return if symptoms worsen or fail to improve.   [facility] [address], [address] [place], [place]  [address] [phone]

## 2023-02-04 ENCOUNTER — Ambulatory Visit (INDEPENDENT_AMBULATORY_CARE_PROVIDER_SITE_OTHER): Payer: BC Managed Care – PPO | Admitting: Urology

## 2023-02-04 VITALS — BP 148/93 | HR 56 | Ht 60.0 in | Wt 122.0 lb

## 2023-02-04 DIAGNOSIS — N390 Urinary tract infection, site not specified: Secondary | ICD-10-CM

## 2023-02-04 DIAGNOSIS — R3121 Asymptomatic microscopic hematuria: Secondary | ICD-10-CM

## 2023-02-04 DIAGNOSIS — Z87440 Personal history of urinary (tract) infections: Secondary | ICD-10-CM | POA: Diagnosis not present

## 2023-02-04 LAB — URINALYSIS, COMPLETE
Bilirubin, UA: NEGATIVE
Glucose, UA: NEGATIVE
Ketones, UA: NEGATIVE
Leukocytes,UA: NEGATIVE
Nitrite, UA: NEGATIVE
Protein,UA: NEGATIVE
RBC, UA: NEGATIVE
Specific Gravity, UA: 1.01 (ref 1.005–1.030)
Urobilinogen, Ur: 0.2 mg/dL (ref 0.2–1.0)
pH, UA: 6 (ref 5.0–7.5)

## 2023-02-04 LAB — MICROSCOPIC EXAMINATION: Bacteria, UA: NONE SEEN

## 2023-02-04 NOTE — Progress Notes (Signed)
   02/04/23  CC:  Chief Complaint  Patient presents with   Cysto    HPI: 67 year old female with a personal history of recurrent UTIs who presents today for cystoscopy for evaluation of incidental microscopic hematuria.  She is never smoker.  She does have a family history of kidney cancer.  No history of gross hematuria.  Blood pressure (!) 148/93, pulse (!) 56, height 5' (1.524 m), weight 122 lb (55.3 kg). NED. A&Ox3.   No respiratory distress   Abd soft, NT, ND Normal external genitalia with patent urethral meatus mild periurethral atrophy appreciated  Cystoscopy Procedure Note  Patient identification was confirmed, informed consent was obtained, and patient was prepped using Betadine solution.  Lidocaine jelly was administered per urethral meatus.    Procedure: - Flexible cystoscope introduced, without any difficulty.   - Thorough search of the bladder revealed:    normal urethral meatus    normal urothelium    no stones    no ulcers     no tumors    no urethral polyps    no trabeculation  - Ureteral orifices were normal in position and appearance.  Post-Procedure: - Patient tolerated the procedure well  Assessment/ Plan:  1. Asymptomatic microscopic hematuria Ostomy today is unremarkable  Her urinalysis is also negative today which is reassuring  Given that she is relatively low risk, never smoker and low volume of microscopic blood on 1 isolated occasion, would favor upper tract imaging in the form a renal ultrasound was compared to CT urogram.  Risk and benefits were discussed.  Plan for renal ultrasound, call with results and escalate imaging as appropriate.  2. Recurrent UTI As per previous visit - Urinalysis, Complete - Ultrasound renal complete; Future   Will call with renal ultrasound  Vanna Scotland, MD

## 2023-03-05 ENCOUNTER — Ambulatory Visit
Admission: RE | Admit: 2023-03-05 | Discharge: 2023-03-05 | Disposition: A | Discharge status: Home / Self Care | Payer: BC Managed Care – PPO | Source: Ambulatory Visit | Attending: Urology | Admitting: Urology

## 2023-03-05 DIAGNOSIS — N390 Urinary tract infection, site not specified: Secondary | ICD-10-CM | POA: Insufficient documentation

## 2024-02-11 ENCOUNTER — Other Ambulatory Visit: Payer: Self-pay | Admitting: Family Medicine

## 2024-02-11 DIAGNOSIS — Z1231 Encounter for screening mammogram for malignant neoplasm of breast: Secondary | ICD-10-CM

## 2024-02-26 ENCOUNTER — Other Ambulatory Visit: Payer: Self-pay | Admitting: Medical Genetics

## 2024-03-02 ENCOUNTER — Ambulatory Visit
Admission: RE | Admit: 2024-03-02 | Discharge: 2024-03-02 | Disposition: A | Discharge status: Home / Self Care | Source: Ambulatory Visit | Attending: Family Medicine | Admitting: Family Medicine

## 2024-03-02 ENCOUNTER — Other Ambulatory Visit
Admission: RE | Admit: 2024-03-02 | Discharge: 2024-03-02 | Disposition: A | Discharge status: Home / Self Care | Source: Ambulatory Visit | Attending: Medical Genetics | Admitting: Medical Genetics

## 2024-03-02 DIAGNOSIS — Z1231 Encounter for screening mammogram for malignant neoplasm of breast: Secondary | ICD-10-CM | POA: Insufficient documentation

## 2024-03-03 ENCOUNTER — Encounter

## 2024-03-11 LAB — GENECONNECT MOLECULAR SCREEN: Genetic Analysis Overall Interpretation: NEGATIVE

## 2024-06-26 ENCOUNTER — Other Ambulatory Visit: Payer: Self-pay

## 2024-06-26 ENCOUNTER — Emergency Department
Admission: EM | Admit: 2024-06-26 | Discharge: 2024-06-26 | Disposition: A | Discharge status: Home / Self Care | Attending: Emergency Medicine | Admitting: Emergency Medicine

## 2024-06-26 ENCOUNTER — Emergency Department

## 2024-06-26 DIAGNOSIS — S065XAA Traumatic subdural hemorrhage with loss of consciousness status unknown, initial encounter: Secondary | ICD-10-CM

## 2024-06-26 DIAGNOSIS — R519 Headache, unspecified: Secondary | ICD-10-CM

## 2024-06-26 DIAGNOSIS — W19XXXA Unspecified fall, initial encounter: Secondary | ICD-10-CM

## 2024-06-26 LAB — CBC
HCT: 41.1 % (ref 36.0–46.0)
Hemoglobin: 13.2 g/dL (ref 12.0–15.0)
MCH: 29.9 pg (ref 26.0–34.0)
MCHC: 32.1 g/dL (ref 30.0–36.0)
MCV: 93 fL (ref 80.0–100.0)
Platelets: 219 K/uL (ref 150–400)
RBC: 4.42 MIL/uL (ref 3.87–5.11)
RDW: 12.7 % (ref 11.5–15.5)
WBC: 6.8 K/uL (ref 4.0–10.5)
nRBC: 0 % (ref 0.0–0.2)

## 2024-06-26 LAB — COMPREHENSIVE METABOLIC PANEL WITH GFR
ALT: 45 U/L — ABNORMAL HIGH (ref 0–44)
AST: 28 U/L (ref 15–41)
Albumin: 4.6 g/dL (ref 3.5–5.0)
Alkaline Phosphatase: 59 U/L (ref 38–126)
Anion gap: 10 (ref 5–15)
BUN: 16 mg/dL (ref 8–23)
CO2: 29 mmol/L (ref 22–32)
Calcium: 9.8 mg/dL (ref 8.9–10.3)
Chloride: 105 mmol/L (ref 98–111)
Creatinine, Ser: 0.91 mg/dL (ref 0.44–1.00)
GFR, Estimated: >60 mL/min (ref >60)
Glucose, Bld: 123 mg/dL — ABNORMAL HIGH (ref 70–99)
Potassium: 4.8 mmol/L (ref 3.5–5.1)
Sodium: 143 mmol/L (ref 135–145)
Total Bilirubin: 0.4 mg/dL (ref 0.0–1.2)
Total Protein: 7.1 g/dL (ref 6.5–8.1)

## 2024-06-26 LAB — URINALYSIS, ROUTINE W REFLEX MICROSCOPIC
Bilirubin Urine: NEGATIVE
Glucose, UA: NEGATIVE mg/dL
Hgb urine dipstick: NEGATIVE
Ketones, ur: NEGATIVE mg/dL
Leukocytes,Ua: NEGATIVE
Nitrite: NEGATIVE
Protein, ur: NEGATIVE mg/dL
Specific Gravity, Urine: 1.017 (ref 1.005–1.030)
pH: 7 (ref 5.0–8.0)

## 2024-06-26 LAB — TROPONIN T, HIGH SENSITIVITY: Troponin T High Sensitivity: <15 ng/L (ref 0–19)

## 2024-06-26 MED ORDER — BUTALBITAL-APAP-CAFFEINE 50-325-40 MG PO TABS
1.0000 | ORAL_TABLET | Freq: Once | ORAL | Status: AC
  Administered 2024-06-26: 1 via ORAL
  Filled 2024-06-26: qty 1

## 2024-06-26 NOTE — ED Triage Notes (Signed)
 Pt to ED POV from home for fall last night. Pt got up to get ice water and felt suddenly dizzy and clammy and then had syncopal episode and fell onto back of head and L elbow. States right afterward she vomited once. Pt is ambulatory with steady gait.

## 2024-06-26 NOTE — ED Provider Notes (Signed)
 [facility] Provider Note    Event Date/Time   First MD Initiated Contact with Patient 06/26/24 1245     (approximate)   History   Loss of Consciousness and Fall   HPI  Alyssa White is a 68 y.o. female who presents to the emergency department today because of concerns for bad headache after a fall last night.  Patient states that she had been in bed and she got up to go to the kitchen around 9:00pm when she started feeling lightheaded and dizzy.  She then passed out.  She had the back of her head against the floor.  She said that she did vomit immediately after the injury but was able to then go to bed.  She slept through the night but when she woke up this morning she had a bad headache which has persisted throughout the day.  She is not on any blood thinners.     Physical Exam   Triage Vital Signs: ED Triage Vitals  Encounter Vitals Group     BP 06/26/24 1135 (!) 146/86     Girls Systolic BP Percentile --      Girls Diastolic BP Percentile --      Boys Systolic BP Percentile --      Boys Diastolic BP Percentile --      Pulse Rate 06/26/24 1135 78     Resp 06/26/24 1135 20     Temp 06/26/24 1135 98.3 F (36.8 C)     Temp Source 06/26/24 1135 Oral     SpO2 06/26/24 1135 99 %     Weight 06/26/24 1137 107 lb (48.5 kg)     Height 06/26/24 1137 5' (1.524 m)     Head Circumference --      Peak Flow --      Pain Score 06/26/24 1137 7     Pain Loc --      Pain Education --      Exclude from Growth Chart --     Most recent vital signs: Vitals:   06/26/24 1135  BP: (!) 146/86  Pulse: 78  Resp: 20  Temp: 98.3 F (36.8 C)  SpO2: 99%   General: Awake, alert, oriented. CV:  Good peripheral perfusion. Regular rate and rhythm. Resp:  Normal effort. Lungs clear. Abd:  No distention.  Other:  PERRL. EOMI. Face symmetric. Tongue midline. Strength 5/5 in upper and lower extremities. Sensation grossly intact.    ED Results / Procedures /  Treatments   Labs (all labs ordered are listed, but only abnormal results are displayed) Labs Reviewed  COMPREHENSIVE METABOLIC PANEL WITH GFR - Abnormal; Notable for the following components:      Result Value   Glucose, Bld 123 (*)    ALT 45 (*)    All other components within normal limits  CBC  URINALYSIS, ROUTINE W REFLEX MICROSCOPIC  CBG MONITORING, ED  TROPONIN T, HIGH SENSITIVITY     EKG  I, Guadalupe Eagles, attending physician, personally viewed and interpreted this EKG  EKG Time: 1140 Rate: 76 Rhythm: normal sinus rhythm Axis: normal Intervals: qtc 416 QRS: narrow, q waves v1, v2 ST changes: no st elevation Impression: abnormal ekg   RADIOLOGY I independently interpreted and visualized the CT head. My interpretation: small subdural  Radiology interpretation:  IMPRESSION:  1. Small volume Right tentorial subdural hemorrhage (2 mm). No midline shift or  mass effect. No other acute intracranial abnormality.  2. Right scalp soft tissue injury,  no skull fracture.   I, Guadalupe Eagles, personally discussed these images (CT head) and results by phone with the on-call radiologist and used this discussion as part of my medical decision making.    I independently interpreted and visualized the CT cervical spine. My interpretation: no fracture Radiology interpretation:  IMPRESSION:  1. No acute traumatic injury identified in the cervical spine.  2. Multilevel chronic cervical spine degeneration.      PROCEDURES:  Critical Care performed: No    MEDICATIONS ORDERED IN ED: Medications - No data to display   IMPRESSION / MDM / ASSESSMENT AND PLAN / ED COURSE  I reviewed the triage vital signs and the nursing notes.                              Differential diagnosis includes, but is not limited to, concussion, ICH, fracture, anemia, electrolyte abnormality  Patient's presentation is most consistent with acute presentation with potential threat to life or  bodily function.   Patient presented to the emergency department today because of concerns for bad headache after syncopal episode and fall that occurred last night.  Terms of the syncope blood work here without any concerning anemia or electrolyte abnormality.  EKG without concerning arrhythmia.  Do wonder if patient had a vasovagal episode.  In terms of the bad headache CT scan was concerning for a small subdural hematoma.  Discussed with Dr. Deatrice with neurosurgery.  At this time given that the injury occurred last night did not feel any repeat imaging was necessary.  He will help arrange outpatient follow-up.  Discussed findings and plan with patient.   FINAL CLINICAL IMPRESSION(S) / ED DIAGNOSES   Final diagnoses:  SDH (subdural hematoma) (HCC)  Fall, initial encounter  Bad headache      Note:  This document was prepared using Dragon voice recognition software and may include unintentional dictation errors.    Eagles Guadalupe, MD 06/26/24 1420

## 2024-06-26 NOTE — ED Notes (Signed)
 Acuity changed due to image results and Charge RN informed

## 2024-06-27 ENCOUNTER — Other Ambulatory Visit: Payer: Self-pay

## 2024-06-27 ENCOUNTER — Telehealth: Payer: Self-pay | Admitting: Neurosurgery

## 2024-06-27 DIAGNOSIS — S065XAA Traumatic subdural hemorrhage with loss of consciousness status unknown, initial encounter: Secondary | ICD-10-CM

## 2024-06-27 NOTE — Telephone Encounter (Signed)
 Made patient follow up appointment. Placed order for head CT to be performed around 07/25/24.

## 2024-06-27 NOTE — Telephone Encounter (Signed)
 Patient is calling to schedule an appointment from ER visit.  She states Dr. Deatrice told her to schedule an appointment this week with a repeat CT.  Please advise if this patient needs to be seen this week and order CT if needed.  Patient can be reached at [phone]  Thank you!

## 2024-06-27 NOTE — Telephone Encounter (Signed)
 The ER called Dr Deatrice while he was covering for us  yesterday. Per Dr Deatrice: 55F 12 hours s/p fall head strike no LOC with ongoing HA with a CT with the barest whiff of a right tentorial leaflet acute SDH (vs. Thickening I would say) PLAN: No need for repeat since 12 hours out, f/u as outpatient with repeat CT will need followup

## 2024-06-27 NOTE — Telephone Encounter (Signed)
 Patient states she only had 1 CT scan while hospitalized. Per Codd's note, she did not get a repeat head CT because she was 12 hours out from her fall. She had told ER that her fall was at 9pm when her fall actually occurred at 2:30am. CT scan done at 12:10pm same day.   She is not experiencing any new or worsening symptoms since fall. Has head pain which she is taking tylenol  for, but the pain has not gotten worse. No vision changes or N/V.   Do the recommendations change or still follow up in 4-6 weeks? Appt scheduled with you on 1/19 and CT ordered for the week before.

## 2024-06-28 NOTE — Telephone Encounter (Signed)
 Dr. Dingeldein at [facility] [phone]) called regarding the patients appointment. They would like to understand why the patient is scheduled so far out. I explained the previous messages, but they are requesting clarification from the surgeons perspective rather than from a PA.

## 2024-06-30 NOTE — Telephone Encounter (Signed)
 Dr Dingledein advised. He also stated to not Dr Maree saw patient yesterday and ordered MRI and EEG-his concern is why did she pass out. Patient is doing better though.

## 2024-06-30 NOTE — Telephone Encounter (Signed)
 Left message for Dr Dingeldein to call back to relay the message below

## 2024-07-04 ENCOUNTER — Ambulatory Visit
Admission: RE | Admit: 2024-07-04 | Discharge: 2024-07-04 | Disposition: A | Discharge status: Home / Self Care | Source: Ambulatory Visit | Attending: Neurology | Admitting: Neurology

## 2024-07-04 ENCOUNTER — Other Ambulatory Visit: Payer: Self-pay | Admitting: Neurology

## 2024-07-04 DIAGNOSIS — R58 Hemorrhage, not elsewhere classified: Secondary | ICD-10-CM | POA: Diagnosis present

## 2024-07-04 DIAGNOSIS — R519 Headache, unspecified: Secondary | ICD-10-CM | POA: Insufficient documentation

## 2024-07-28 ENCOUNTER — Ambulatory Visit

## 2024-08-03 ENCOUNTER — Ambulatory Visit: Admitting: Physician Assistant
